# Patient Record
Sex: Male | Born: 1960 | Race: Black or African American | Hispanic: No | Marital: Single | Smoking: Never smoker
Health system: Southern US, Community
[De-identification: ages and names within clinical notes are randomized; demographics above are authoritative.]

---

## 2009-02-25 ENCOUNTER — Emergency Department: Payer: Self-pay | Admitting: Emergency Medicine

## 2009-10-09 ENCOUNTER — Emergency Department: Payer: Self-pay | Admitting: Emergency Medicine

## 2010-04-08 ENCOUNTER — Emergency Department (HOSPITAL_COMMUNITY): Admission: EM | Admit: 2010-04-08 | Discharge: 2010-04-09 | Payer: Self-pay | Admitting: Emergency Medicine

## 2010-12-15 LAB — URINE MICROSCOPIC-ADD ON

## 2010-12-15 LAB — URINALYSIS, ROUTINE W REFLEX MICROSCOPIC
Bilirubin Urine: NEGATIVE
Glucose, UA: NEGATIVE mg/dL
Ketones, ur: NEGATIVE mg/dL
Leukocytes, UA: NEGATIVE
Protein, ur: NEGATIVE mg/dL
Specific Gravity, Urine: 1.029 (ref 1.005–1.030)
pH: 5.5 (ref 5.0–8.0)

## 2011-04-27 ENCOUNTER — Emergency Department (HOSPITAL_COMMUNITY)
Admission: EM | Admit: 2011-04-27 | Discharge: 2011-04-27 | Disposition: A | Discharge status: Home / Self Care | Payer: Self-pay | Attending: Emergency Medicine | Admitting: Emergency Medicine

## 2011-04-27 DIAGNOSIS — M25539 Pain in unspecified wrist: Secondary | ICD-10-CM | POA: Insufficient documentation

## 2011-04-27 DIAGNOSIS — M65839 Other synovitis and tenosynovitis, unspecified forearm: Secondary | ICD-10-CM | POA: Insufficient documentation

## 2011-04-27 DIAGNOSIS — M65849 Other synovitis and tenosynovitis, unspecified hand: Secondary | ICD-10-CM | POA: Insufficient documentation

## 2012-07-15 ENCOUNTER — Ambulatory Visit: Payer: Self-pay

## 2012-07-15 LAB — CBC WITH DIFFERENTIAL/PLATELET
Basophil #: 0.1 10*3/uL (ref 0.0–0.1)
Eosinophil %: 6.9 %
HGB: 14.9 g/dL (ref 13.0–18.0)
Lymphocyte %: 29.9 %
MCH: 29.3 pg (ref 26.0–34.0)
MCHC: 33.9 g/dL (ref 32.0–36.0)
Monocyte %: 11.2 %
Neutrophil #: 3.1 10*3/uL (ref 1.4–6.5)
Platelet: 142 10*3/uL — ABNORMAL LOW (ref 150–440)
RDW: 14 % (ref 11.5–14.5)

## 2012-07-15 LAB — COMPREHENSIVE METABOLIC PANEL
Alkaline Phosphatase: 70 U/L (ref 50–136)
Anion Gap: 7 (ref 7–16)
Bilirubin,Total: 1 mg/dL (ref 0.2–1.0)
Calcium, Total: 9.3 mg/dL (ref 8.5–10.1)
Chloride: 103 mmol/L (ref 98–107)
Co2: 26 mmol/L (ref 21–32)
EGFR (African American): >60
EGFR (Non-African Amer.): >60
Osmolality: 275 (ref 275–301)
Potassium: 3.8 mmol/L (ref 3.5–5.1)
Sodium: 136 mmol/L (ref 136–145)

## 2013-05-08 ENCOUNTER — Emergency Department: Payer: Self-pay | Admitting: Emergency Medicine

## 2013-05-08 LAB — CBC WITH DIFFERENTIAL/PLATELET
Eosinophil %: 4.6 %
HCT: 42.7 % (ref 40.0–52.0)
MCHC: 34.8 g/dL (ref 32.0–36.0)
Monocyte #: 0.8 x10 3/mm (ref 0.2–1.0)
Neutrophil #: 6 10*3/uL (ref 1.4–6.5)
Neutrophil %: 63.9 %
Platelet: 156 10*3/uL (ref 150–440)
RDW: 14.4 % (ref 11.5–14.5)
WBC: 9.4 10*3/uL (ref 3.8–10.6)

## 2013-05-08 LAB — COMPREHENSIVE METABOLIC PANEL
Anion Gap: 5 — ABNORMAL LOW (ref 7–16)
Bilirubin,Total: 0.6 mg/dL (ref 0.2–1.0)
Chloride: 105 mmol/L (ref 98–107)
Co2: 28 mmol/L (ref 21–32)
EGFR (African American): >60
SGOT(AST): 31 U/L (ref 15–37)
SGPT (ALT): 23 U/L (ref 12–78)

## 2013-05-08 LAB — TROPONIN I: Troponin-I: <0.02 ng/mL

## 2013-05-13 ENCOUNTER — Ambulatory Visit: Payer: Self-pay | Admitting: Family Medicine

## 2013-05-20 ENCOUNTER — Ambulatory Visit: Payer: Self-pay | Admitting: Family Medicine

## 2013-11-14 ENCOUNTER — Encounter (HOSPITAL_COMMUNITY): Payer: Self-pay | Admitting: Emergency Medicine

## 2013-11-14 ENCOUNTER — Emergency Department (HOSPITAL_COMMUNITY)
Admission: EM | Admit: 2013-11-14 | Discharge: 2013-11-14 | Disposition: A | Discharge status: Home / Self Care | Payer: Managed Care, Other (non HMO) | Attending: Emergency Medicine | Admitting: Emergency Medicine

## 2013-11-14 DIAGNOSIS — Y9389 Activity, other specified: Secondary | ICD-10-CM | POA: Insufficient documentation

## 2013-11-14 DIAGNOSIS — M549 Dorsalgia, unspecified: Secondary | ICD-10-CM

## 2013-11-14 DIAGNOSIS — IMO0002 Reserved for concepts with insufficient information to code with codable children: Secondary | ICD-10-CM | POA: Insufficient documentation

## 2013-11-14 DIAGNOSIS — M542 Cervicalgia: Secondary | ICD-10-CM

## 2013-11-14 DIAGNOSIS — S0993XA Unspecified injury of face, initial encounter: Secondary | ICD-10-CM | POA: Insufficient documentation

## 2013-11-14 DIAGNOSIS — S199XXA Unspecified injury of neck, initial encounter: Secondary | ICD-10-CM

## 2013-11-14 DIAGNOSIS — Y92410 Unspecified street and highway as the place of occurrence of the external cause: Secondary | ICD-10-CM | POA: Insufficient documentation

## 2013-11-14 MED ORDER — METHOCARBAMOL 500 MG PO TABS: 500.0000 mg | ORAL_TABLET | Freq: Two times a day (BID) | ORAL | Status: DC | PRN

## 2013-11-14 MED ORDER — HYDROCODONE-ACETAMINOPHEN 5-325 MG PO TABS: 1.0000 | ORAL_TABLET | ORAL | Status: DC | PRN
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
## 2013-11-14 NOTE — Discharge Instructions (Signed)
Take the prescribed medication as directed.  You will continue to be sore for the next several days.  May wish to apply heat to neck and back to help ease muscle soreness. Follow-up with your primary care physician if problems occur. Return to the ED for new or worsening symptoms.
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
## 2013-11-18 NOTE — ED Provider Notes (Signed)
Medical screening examination/treatment/procedure(s) were performed by non-physician practitioner and as supervising physician I was immediately available for consultation/collaboration.  EKG Interpretation   None         Teasia Zapf, MD 11/18/13 0822 

## 2014-12-19 ENCOUNTER — Ambulatory Visit (INDEPENDENT_AMBULATORY_CARE_PROVIDER_SITE_OTHER): Payer: Managed Care, Other (non HMO) | Admitting: Podiatry

## 2014-12-19 ENCOUNTER — Ambulatory Visit (INDEPENDENT_AMBULATORY_CARE_PROVIDER_SITE_OTHER): Payer: Managed Care, Other (non HMO)

## 2014-12-19 ENCOUNTER — Encounter: Payer: Self-pay | Admitting: Podiatry

## 2014-12-19 VITALS — BP 136/74 | HR 92 | Resp 16 | Ht 72.0 in | Wt 195.0 lb

## 2014-12-19 DIAGNOSIS — M722 Plantar fascial fibromatosis: Secondary | ICD-10-CM

## 2014-12-19 DIAGNOSIS — M779 Enthesopathy, unspecified: Secondary | ICD-10-CM | POA: Diagnosis not present

## 2014-12-19 MED ORDER — MELOXICAM 15 MG PO TABS: 15.0000 mg | ORAL_TABLET | Freq: Every day | ORAL | Status: DC

## 2014-12-19 NOTE — Progress Notes (Signed)
   Subjective:    Patient ID: Corey Ramos, male    DOB: 10/16/1960, 54 y.o.   MRN: [id number]  HPI 54 year old male presents the office with complaints of left foot and heel pain which has been ongoing for approximate one month. He states he has pain particularly in the bottom of his heel along the side of his foot which is worse in the morning which is relieved by ambulation. He denies any history of injury or trauma to the area and denies any change or increase in activity the time of onset of symptoms. He denies any numbness or tingling. The pain does not wake him up at night. No other complaints at this time.   Review of Systems  All other systems reviewed and are negative.      Objective:   Physical Exam AAO x3, NAD DP/PT pulses palpable bilaterally, CRT less than 3 seconds Protective sensation intact with Simms Weinstein monofilament, vibratory sensation intact, Achilles tendon reflex intact Tenderness to palpation overlying the plantar medial tubercle of the calcaneus to left heel at the insertion of the plantar fascia. There is no pain along the course of plantar fascial within the arch of the foot. There is no pain with lateral compression of the calcaneus or pain the vibratory sensation. No pain on the posterior aspect of the calcaneus or along the course/insertion of the Achilles tendon. Mild discomfort along the course of the peroneal tendons on the lateral aspect of the left foot. There is no pain with range of motion of fourth to person. Peroneal tendons appear to be intact. There is no overlying edema, erythema, increase in warmth. No other areas of tenderness palpation or pain with vibratory sensation to the foot/ankle. MMT 5/5, ROM WNL No open lesions or pre-ulcerative lesions are identified. No pain with calf compression, swelling, warmth, erythema.      Assessment & Plan:  54 year old male left foot plantar fasciitis, peroneal tendinitis -X-rays were obtained and  reviewed with the patient. -Treatment options were discussed including alternatives, risks, complications. -Discussed steroid injection. -Prescribed meloxicam. Discussed side effects the medication directed to stop immediately should any occur and call the office. -Discussed stretching exercises -Ice to the area -Discussed shoe gear modification and orthotics. -Follow-up in 4 weeks or sooner if any problems are to arise. In the meantime encouraged to call the office with any questions, concerns, change in symptoms.

## 2014-12-19 NOTE — Patient Instructions (Signed)

## 2014-12-24 ENCOUNTER — Encounter: Payer: Self-pay | Admitting: Podiatry

## 2015-01-11 ENCOUNTER — Ambulatory Visit: Payer: Managed Care, Other (non HMO) | Admitting: Podiatry

## 2015-01-18 ENCOUNTER — Ambulatory Visit (INDEPENDENT_AMBULATORY_CARE_PROVIDER_SITE_OTHER): Payer: Managed Care, Other (non HMO) | Admitting: Podiatry

## 2015-01-18 VITALS — BP 126/74 | HR 90 | Resp 16

## 2015-01-18 DIAGNOSIS — M722 Plantar fascial fibromatosis: Secondary | ICD-10-CM

## 2015-01-18 NOTE — Patient Instructions (Signed)
Plantar Fasciitis (Heel Spur Syndrome) with Rehab The plantar fascia is a fibrous, ligament-like, soft-tissue structure that spans the bottom of the foot. Plantar fasciitis is a condition that causes pain in the foot due to inflammation of the tissue. SYMPTOMS   Pain and tenderness on the underneath side of the foot.  Pain that worsens with standing or walking. CAUSES  Plantar fasciitis is caused by irritation and injury to the plantar fascia on the underneath side of the foot. Common mechanisms of injury include:  Direct trauma to bottom of the foot.  Damage to a small nerve that runs under the foot where the main fascia attaches to the heel bone.  Stress placed on the plantar fascia due to bone spurs. RISK INCREASES WITH:   Activities that place stress on the plantar fascia (running, jumping, pivoting, or cutting).  Poor strength and flexibility.  Improperly fitted shoes.  Tight calf muscles.  Flat feet.  Failure to warm-up properly before activity.  Obesity. PREVENTION  Warm up and stretch properly before activity.  Allow for adequate recovery between workouts.  Maintain physical fitness:  Strength, flexibility, and endurance.  Cardiovascular fitness.  Maintain a health body weight.  Avoid stress on the plantar fascia.  Wear properly fitted shoes, including arch supports for individuals who have flat feet. PROGNOSIS  If treated properly, then the symptoms of plantar fasciitis usually resolve without surgery. However, occasionally surgery is necessary. RELATED COMPLICATIONS   Recurrent symptoms that may result in a chronic condition.  Problems of the lower back that are caused by compensating for the injury, such as limping.  Pain or weakness of the foot during push-off following surgery.  Chronic inflammation, scarring, and partial or complete fascia tear, occurring more often from repeated injections. TREATMENT  Treatment initially involves the use of  ice and medication to help reduce pain and inflammation. The use of strengthening and stretching exercises may help reduce pain with activity, especially stretches of the Achilles tendon. These exercises may be performed at home or with a therapist. Your caregiver may recommend that you use heel cups of arch supports to help reduce stress on the plantar fascia. Occasionally, corticosteroid injections are given to reduce inflammation. If symptoms persist for greater than 6 months despite non-surgical (conservative), then surgery may be recommended.  MEDICATION   If pain medication is necessary, then nonsteroidal anti-inflammatory medications, such as aspirin and ibuprofen, or other minor pain relievers, such as acetaminophen, are often recommended.  Do not take pain medication within 7 days before surgery.  Prescription pain relievers may be given if deemed necessary by your caregiver. Use only as directed and only as much as you need.  Corticosteroid injections may be given by your caregiver. These injections should be reserved for the most serious cases, because they may only be given a certain number of times. HEAT AND COLD  Cold treatment (icing) relieves pain and reduces inflammation. Cold treatment should be applied for 10 to 15 minutes every 2 to 3 hours for inflammation and pain and immediately after any activity that aggravates your symptoms. Use ice packs or massage the area with a piece of ice (ice massage).  Heat treatment may be used prior to performing the stretching and strengthening activities prescribed by your caregiver, physical therapist, or athletic trainer. Use a heat pack or soak the injury in warm water. SEEK IMMEDIATE MEDICAL CARE IF:  Treatment seems to offer no benefit, or the condition worsens.  Any medications produce adverse side effects. EXERCISES RANGE   OF MOTION (ROM) AND STRETCHING EXERCISES - Plantar Fasciitis (Heel Spur Syndrome) These exercises may help you  when beginning to rehabilitate your injury. Your symptoms may resolve with or without further involvement from your physician, physical therapist or athletic trainer. While completing these exercises, remember:   Restoring tissue flexibility helps normal motion to return to the joints. This allows healthier, less painful movement and activity.  An effective stretch should be held for at least 30 seconds.  A stretch should never be painful. You should only feel a gentle lengthening or release in the stretched tissue. RANGE OF MOTION - Toe Extension, Flexion  Sit with your right / left leg crossed over your opposite knee.  Grasp your toes and gently pull them back toward the top of your foot. You should feel a stretch on the bottom of your toes and/or foot.  Hold this stretch for __________ seconds.  Now, gently pull your toes toward the bottom of your foot. You should feel a stretch on the top of your toes and or foot.  Hold this stretch for __________ seconds. Repeat __________ times. Complete this stretch __________ times per day.  RANGE OF MOTION - Ankle Dorsiflexion, Active Assisted  Remove shoes and sit on a chair that is preferably not on a carpeted surface.  Place right / left foot under knee. Extend your opposite leg for support.  Keeping your heel down, slide your right / left foot back toward the chair until you feel a stretch at your ankle or calf. If you do not feel a stretch, slide your bottom forward to the edge of the chair, while still keeping your heel down.  Hold this stretch for __________ seconds. Repeat __________ times. Complete this stretch __________ times per day.  STRETCH - Gastroc, Standing  Place hands on wall.  Extend right / left leg, keeping the front knee somewhat bent.  Slightly point your toes inward on your back foot.  Keeping your right / left heel on the floor and your knee straight, shift your weight toward the wall, not allowing your back to  arch.  You should feel a gentle stretch in the right / left calf. Hold this position for __________ seconds. Repeat __________ times. Complete this stretch __________ times per day. STRETCH - Soleus, Standing  Place hands on wall.  Extend right / left leg, keeping the other knee somewhat bent.  Slightly point your toes inward on your back foot.  Keep your right / left heel on the floor, bend your back knee, and slightly shift your weight over the back leg so that you feel a gentle stretch deep in your back calf.  Hold this position for __________ seconds. Repeat __________ times. Complete this stretch __________ times per day. STRETCH - Gastrocsoleus, Standing  Note: This exercise can place a lot of stress on your foot and ankle. Please complete this exercise only if specifically instructed by your caregiver.   Place the ball of your right / left foot on a step, keeping your other foot firmly on the same step.  Hold on to the wall or a rail for balance.  Slowly lift your other foot, allowing your body weight to press your heel down over the edge of the step.  You should feel a stretch in your right / left calf.  Hold this position for __________ seconds.  Repeat this exercise with a slight bend in your right / left knee. Repeat __________ times. Complete this stretch __________ times per day.    STRENGTHENING EXERCISES - Plantar Fasciitis (Heel Spur Syndrome)  These exercises may help you when beginning to rehabilitate your injury. They may resolve your symptoms with or without further involvement from your physician, physical therapist or athletic trainer. While completing these exercises, remember:   Muscles can gain both the endurance and the strength needed for everyday activities through controlled exercises.  Complete these exercises as instructed by your physician, physical therapist or athletic trainer. Progress the resistance and repetitions only as guided. STRENGTH -  Towel Curls  Sit in a chair positioned on a non-carpeted surface.  Place your foot on a towel, keeping your heel on the floor.  Pull the towel toward your heel by only curling your toes. Keep your heel on the floor.  If instructed by your physician, physical therapist or athletic trainer, add ____________________ at the end of the towel. Repeat __________ times. Complete this exercise __________ times per day. STRENGTH - Ankle Inversion  Secure one end of a rubber exercise band/tubing to a fixed object (table, pole). Loop the other end around your foot just before your toes.  Place your fists between your knees. This will focus your strengthening at your ankle.  Slowly, pull your big toe up and in, making sure the band/tubing is positioned to resist the entire motion.  Hold this position for __________ seconds.  Have your muscles resist the band/tubing as it slowly pulls your foot back to the starting position. Repeat __________ times. Complete this exercises __________ times per day.  Document Released: 09/15/2005 Document Revised: 12/08/2011 Document Reviewed: 12/28/2008 [employer] Patient Information 2015 [employer]. This information is not intended to replace advice given to you by your health care provider. Make sure you discuss any questions you have with your health care provider.  

## 2015-01-24 ENCOUNTER — Encounter: Payer: Self-pay | Admitting: Podiatry

## 2015-01-24 NOTE — Progress Notes (Signed)
Patient ID: Corey Ramos, male   DOB: 09/16/1961, 54 y.o.   MRN: [id number]  Subjective: 54 year old male presents the office they follow up evaluation of left heel pain, plantar fasciitis and peroneal tendinitis. He states that he is much improved compared to last appointment however he is not 100%. He states that he continues with mild intermittent discomfort to the bottom of the heel a states the area feels tight. He has no pain along the office after the ankle along the course of the peroneal tendons. He's been continuing stretching icing activities. Denies any systemic complaints such as fevers, chills, nausea, vomiting. No acute changes since last appointment, and no other complaints at this time.   Objective: AAO x3, NAD DP/PT pulses palpable bilaterally, CRT less than 3 seconds Protective sensation intact with Simms Weinstein monofilament, vibratory sensation intact, Achilles tendon reflex intact There is mild tenderness to palpation overlying the plantar medial tubercle of the calcaneus to left heel at the insertion of the plantar fascia although it does appear improved compared to previous appointment. There is no pain along the course of plantar fascial within the arch of the foot and the plantar fascia appears intact. There is no pain with lateral compression of the calcaneus or pain the vibratory sensation. No pain on the posterior aspect of the calcaneus or along the course/insertion of the Achilles tendon. There is no discomfort along the course of the peroneal tendons. There is no overlying edema, erythema, increase in warmth. No other areas of tenderness palpation or pain with vibratory sensation to the foot/ankle. MMT 5/5, ROM WNL No open lesions or pre-ulcerative lesions are identified. No pain with calf compression, swelling, warmth, erythema.  Assessment: 54 year old male with resolving left heel pain, plantar fasciitis  Plan: -All treatment options discussed with the patient  including all alternatives, risks, complications.  -At this time discussed another steroid injection however patient was to hold off at this time. -Dispensed night splint -Continue stretching activities -Ice to the area -Discussed shoe gear modifications and possible orthotics -Follow-up in 4 weeks or sooner if any problems are to arise. -Patient encouraged to call the office with any questions, concerns, change in symptoms.

## 2015-02-15 ENCOUNTER — Ambulatory Visit: Payer: Managed Care, Other (non HMO) | Admitting: Podiatry

## 2015-05-28 IMAGING — CT CT ABDOMEN W/ CM
2 of 4 series · 14 of 32 positions shown, 19 images · non-contrast
Comparison: none

REASON FOR EXAM: liver mass seen on US
COMMENTS:

[Series 4: arterial 3 · axial · arterial · 0.72mm/px · z∈[-720,-570]mm · 6 of 91 slices shown]
[im 11/91  soft-tissue]
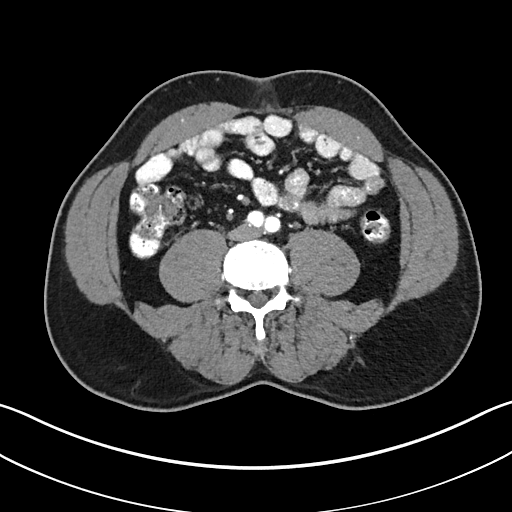
[im 21/91  soft-tissue]
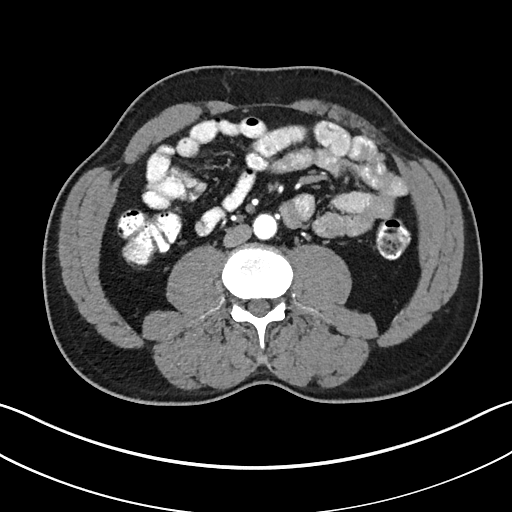
[im 31/91  soft-tissue]
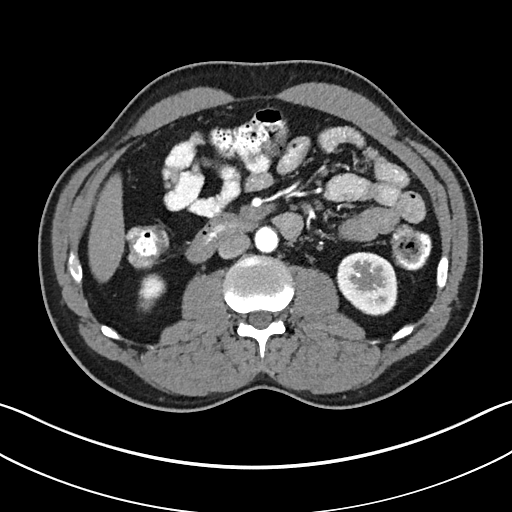
[im 41/91  soft-tissue]
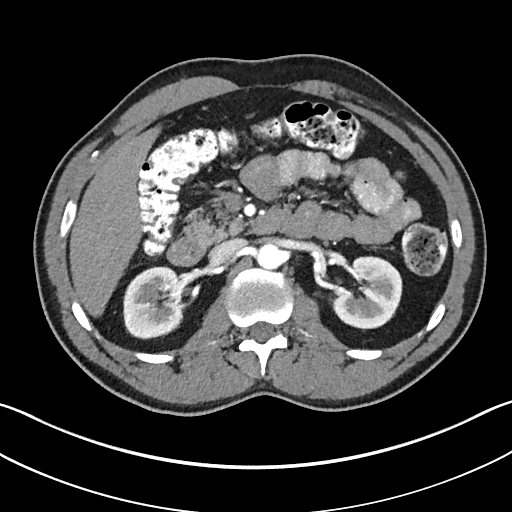
[im 51/91  soft-tissue]
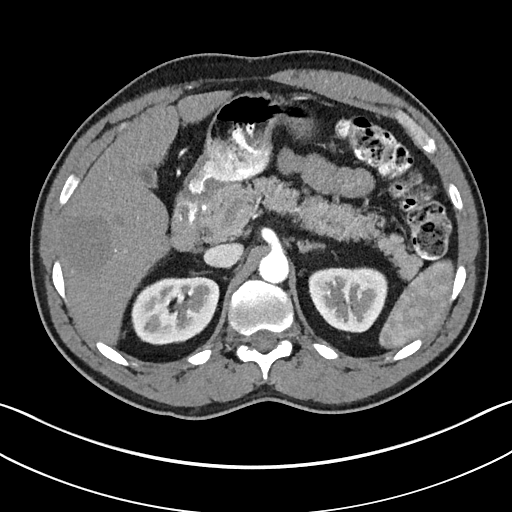
[im 61/91  soft-tissue]
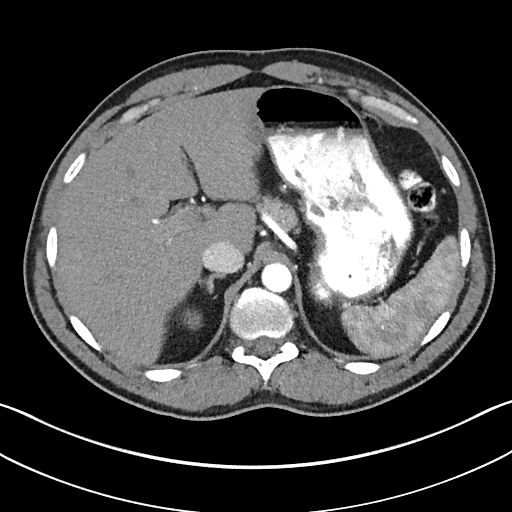

[Series 6: venous 3 · axial · portal-venous · 0.72mm/px · z∈[-708,-510]mm · 8 of 86 slices shown, 13 images]
[im 10/86  soft-tissue]
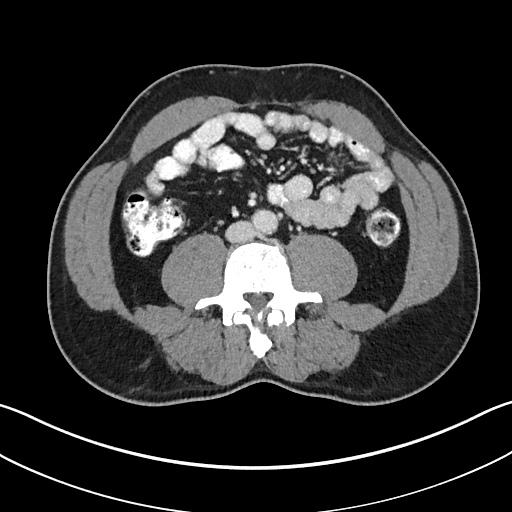
[im 10/86  bone]
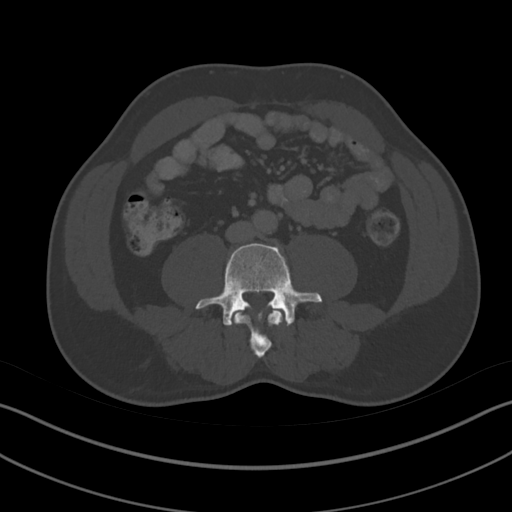
[im 19/86  soft-tissue]
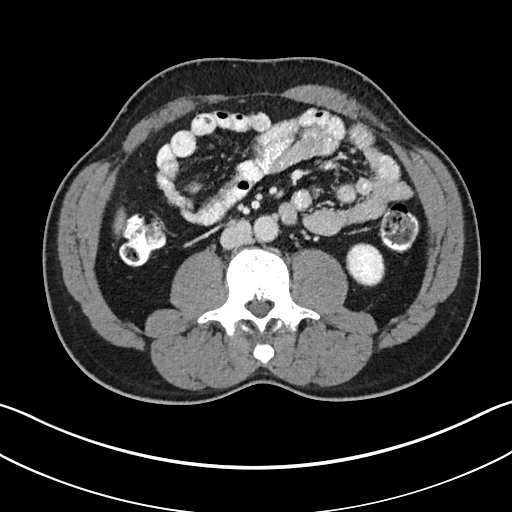
[im 29/86  soft-tissue]
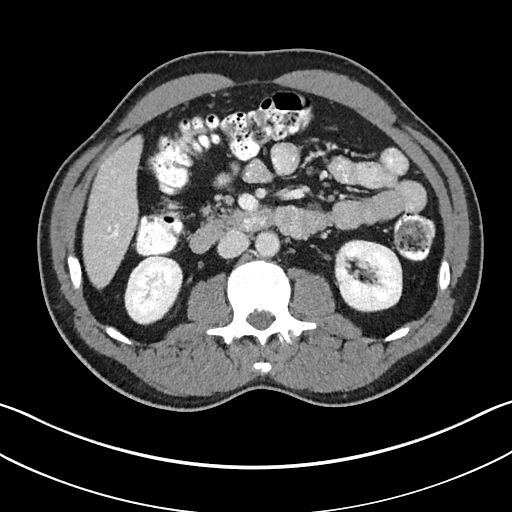
[im 38/86  soft-tissue]
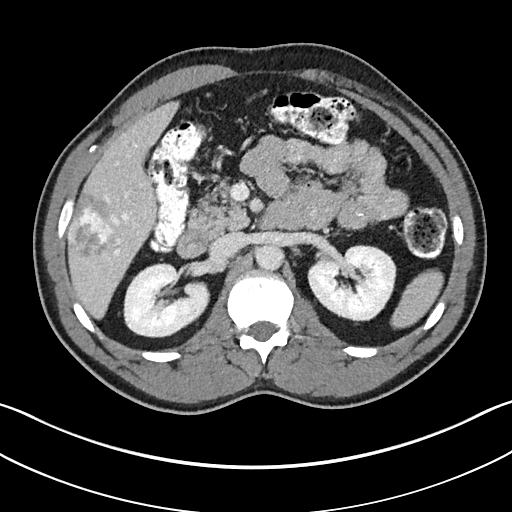
[im 48/86  soft-tissue]
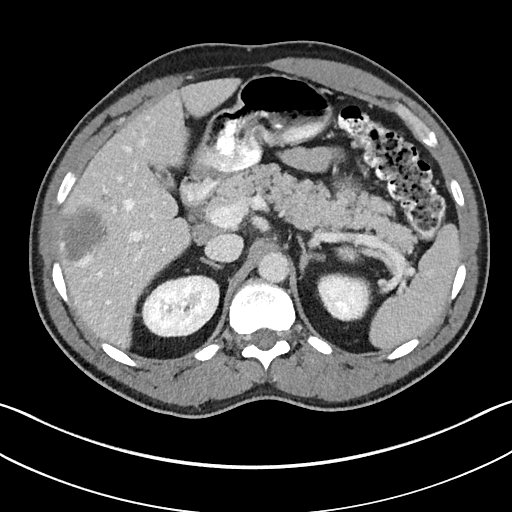
[im 48/86  lung]
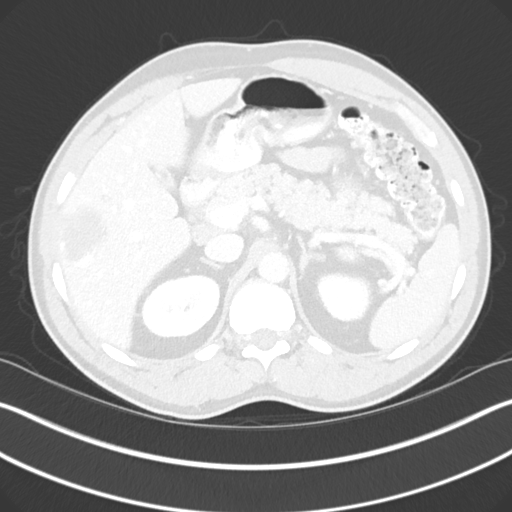
[im 57/86  soft-tissue]
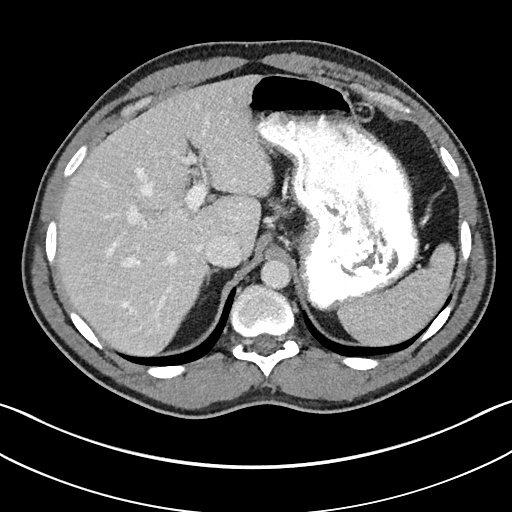
[im 57/86  lung]
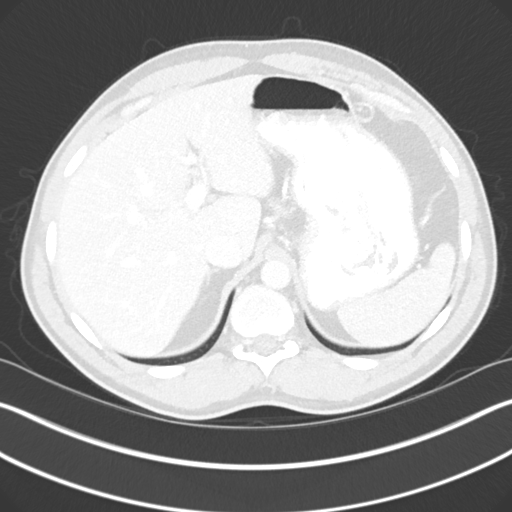
[im 67/86  soft-tissue]
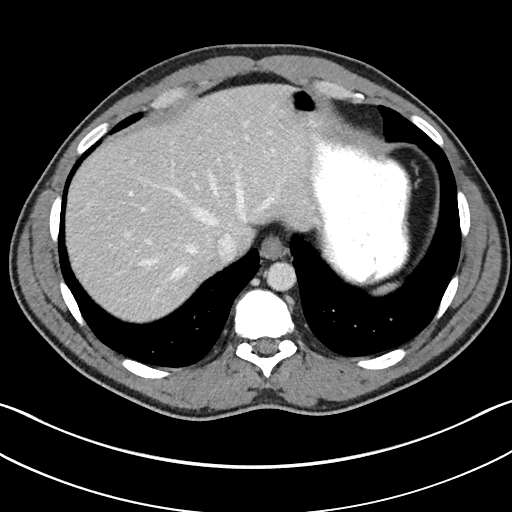
[im 67/86  lung]
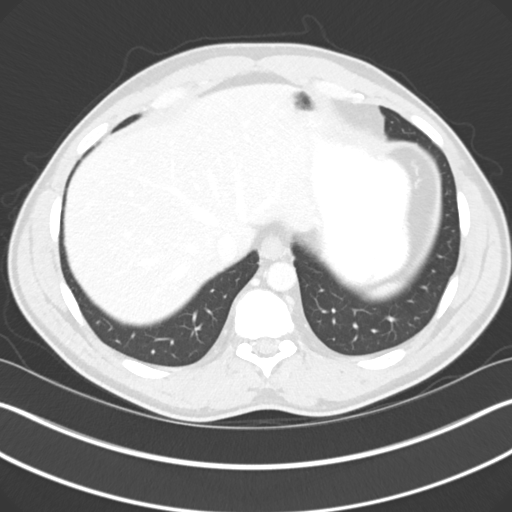
[im 76/86  soft-tissue]
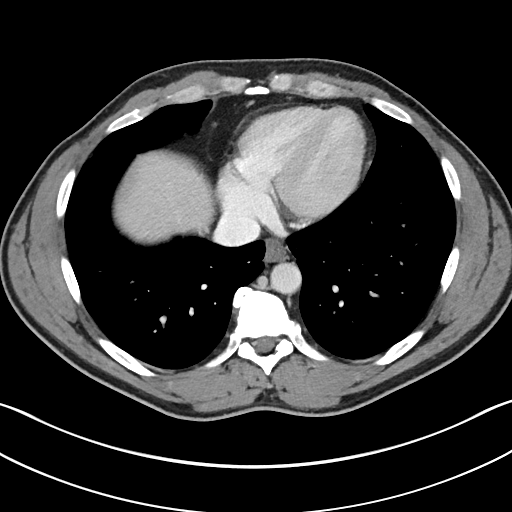
[im 76/86  lung]
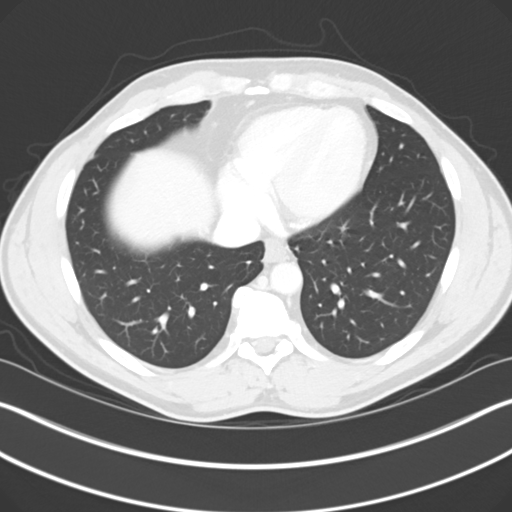

[14 of 32 positions shown; findings below may reference images not displayed]

PROCEDURE:     KCT - KCT ABDOMEN STANDARD W  - May 20, 2013  [DATE]

RESULT:     CT with contrast is performed through the abdomen with images
reconstructed at 3 mm slice thickness in the axial plane compared to
previous noncontrast exam of 05/08/2013. The patient received 100 mL of
Nsovue-SXA iodinated intravenous contrast.

The low-attenuation area laterally in the inferior portion of the right lobe
of the liver shows evidence of peripheral enhancement with a globular
pattern especially inferiorly on today's exam. The lesion measures
approximately 4.37 cm anterior-posterior and 4.07 cm transversely on image
41. Delayed postcontrast images show progressive enhancement marginally
especially inferiorly. A 6 minute delay shows even further enhancement.
Findings consistent with a cavernous hemangioma. The liver otherwise appears
unremarkable. The spleen, kidneys, nondistended gallbladder, pancreas,
adrenal glands, including stomach, small bowel and colon and bony structures
appear unremarkable. There is no pleural or pericardial effusion. The lung
bases are clear.
IMPRESSION: 1. Probable large hemangioma in the inferior right lobe of the liver. Study
is otherwise unremarkable.

[REDACTED]

## 2018-01-11 ENCOUNTER — Ambulatory Visit: Payer: Self-pay | Admitting: Nurse Practitioner

## 2018-01-19 ENCOUNTER — Ambulatory Visit: Payer: Managed Care, Other (non HMO) | Admitting: Family Medicine

## 2018-01-19 ENCOUNTER — Encounter: Payer: Self-pay | Admitting: Family Medicine

## 2018-01-19 VITALS — BP 121/81 | HR 75 | Temp 98.3°F | Resp 16 | Ht 72.0 in | Wt 195.8 lb

## 2018-01-19 DIAGNOSIS — H538 Other visual disturbances: Secondary | ICD-10-CM | POA: Diagnosis not present

## 2018-01-19 DIAGNOSIS — Z7689 Persons encountering health services in other specified circumstances: Secondary | ICD-10-CM

## 2018-01-19 DIAGNOSIS — Z1211 Encounter for screening for malignant neoplasm of colon: Secondary | ICD-10-CM | POA: Diagnosis not present

## 2018-01-19 NOTE — Progress Notes (Signed)
Subjective:    Patient ID: Corey Ramos, male    DOB: [birth year], 57 y.o.   MRN: [id number]  Corey FRANKIE is a 57 y.o. male presenting on 01/19/2018 for Establish Care and Blurred Vision  Previously saw Dr Juanetta Gosling as PCP here at [facility] about 2015, now he is re-establishing care here at [facility], here to meet new provider and he will return for labs and biometric screening.  HPI   Lifestyle / Overweight BMI >26 - Diet: balanced diet, no specific dietary exclusion., drinks mostly water and gatorade, quit drinking soda 1 month ago - Exercise: Active at work in [employer], he does go to [employer] for other exercise about 1-2 weeks out of a month - He works in [employer] representative, various jobs, some heavy Pharmacologist, travels out of state for projects - No known chronic OA/DJD arthritis. He does take OTC Ibuprofen 200mg  x 4 for dose 800mg  PRN, rarely use maybe once every few months. - Currently lives with girlfriend and son  History of cerumen building / wax in past - required cleaning  Blurry Vision Now past 1 month has some difficulty with blurry vision in distance vision, requires reading glasses for close up reading - Previous eye doctor at [facility] - Denies eye pain or redness, discharge   Health Maintenance:  Due routine Hep C and HIV screening - he reports has had these checked before years ago does not have result available, agree to check again.  Prostate CA Screening: No prior prostate CA screening PSA / DRE. Currently asymptomatic w/o BPH. No known family history of prostate CA but he has history of father with cancer at age 67 unknown type. He will find out. Due for screening.  Colon CA Screening: Never had colonoscopy. Currently asymptomatic. No known family history of colon CA again he has father with h/o cancer at age 11. Due for screening test new referral to GI for colonoscopy    Depression screen [facility] 2/9 01/19/2018  Decreased Interest 0  Down,  Depressed, Hopeless 0  PHQ - 2 Score 0    History reviewed. No pertinent past medical history. History reviewed. No pertinent surgical history. Social History   Socioeconomic History  . Marital status: Single    Spouse name: Not on file  . Number of children: Not on file  . Years of education: 11th Grade / GED  . Highest education level: GED or equivalent  Occupational History  . Occupation: Training and development officer  . Financial resource strain: Not on file  . Food insecurity:    Worry: Not on file    Inability: Not on file  . Transportation needs:    Medical: Not on file    Non-medical: Not on file  Tobacco Use  . Smoking status: Never Smoker  . Smokeless tobacco: Never Used  Substance and Sexual Activity  . Alcohol use: Yes    Alcohol/week: 0.6 - 2.4 oz    Types: 1 - 4 Shots of liquor per week    Comment: social, weekends usually  . Drug use: Never  . Sexual activity: Not on file  Lifestyle  . Physical activity:    Days per week: Not on file    Minutes per session: Not on file  . Stress: Not on file  Relationships  . Social connections:    Talks on phone: Not on file    Gets together: Not on file    Attends religious service: Not on file  Active member of club or organization: Not on file    Attends meetings of clubs or organizations: Not on file    Relationship status: Not on file  . Intimate partner violence:    Fear of current or ex partner: Not on file    Emotionally abused: Not on file    Physically abused: Not on file    Forced sexual activity: Not on file  Other Topics Concern  . Not on file  Social History Narrative  . Not on file   Family History  Problem Relation Age of Onset  . Cancer Father   . Colon cancer Neg Hx   . Prostate cancer Neg Hx    No current outpatient medications on file prior to visit.   No current facility-administered medications on file prior to visit.     Review of Systems  Constitutional: Negative for activity  change, appetite change, chills, diaphoresis, fatigue and fever.  HENT: Negative for congestion and hearing loss.   Eyes: Positive for visual disturbance (blurry vision).  Respiratory: Negative for apnea, cough, chest tightness, shortness of breath and wheezing.   Cardiovascular: Negative for chest pain, palpitations and leg swelling.  Gastrointestinal: Negative for abdominal pain, anal bleeding, blood in stool, constipation, diarrhea, nausea and vomiting.  Endocrine: Negative for cold intolerance.  Genitourinary: Negative for difficulty urinating, dysuria, frequency and hematuria.  Musculoskeletal: Negative for arthralgias and neck pain.  Skin: Negative for rash.  Allergic/Immunologic: Negative for environmental allergies.  Neurological: Negative for dizziness, weakness, light-headedness, numbness and headaches.  Hematological: Negative for adenopathy.  Psychiatric/Behavioral: Negative for behavioral problems, dysphoric mood and sleep disturbance. The patient is not nervous/anxious.    Per HPI unless specifically indicated above     Objective:    BP 121/81   Pulse 75   Temp 98.3 F (36.8 C) (Oral)   Resp 16   Ht 6' (1.829 m)   Wt 195 lb 12.8 oz (88.8 kg)   BMI 26.56 kg/m   Wt Readings from Last 3 Encounters:  01/19/18 195 lb 12.8 oz (88.8 kg)  12/19/14 195 lb (88.5 kg)    Physical Exam  Constitutional: He is oriented to person, place, and time. He appears well-developed and well-nourished. No distress.  Well-appearing, comfortable, cooperative  HENT:  Head: Normocephalic and atraumatic.  Mouth/Throat: Oropharynx is clear and moist.  Eyes: Pupils are equal, round, and reactive to light. Conjunctivae and EOM are normal. Right eye exhibits no discharge. Left eye exhibits no discharge.  Cardiovascular: Normal rate.  Pulmonary/Chest: Effort normal.  Abdominal: Soft. There is no tenderness.  Musculoskeletal: He exhibits no edema.  Neurological: He is alert and oriented to  person, place, and time.  Skin: Skin is warm and dry. No rash noted. He is not diaphoretic. No erythema.  Psychiatric: He has a normal mood and affect. His behavior is normal.  Well groomed, good eye contact, normal speech and thoughts  Nursing note and vitals reviewed.  Results for orders placed or performed during the hospital encounter of 04/08/10  Urinalysis, Routine w reflex microscopic  Result Value Ref Range   Color, Urine YELLOW YELLOW   APPearance CLEAR CLEAR   Specific Gravity, Urine 1.029 1.005 - 1.030   pH 5.5 5.0 - 8.0   Glucose, UA NEGATIVE NEGATIVE mg/dL   Hgb urine dipstick LARGE (A) NEGATIVE   Bilirubin Urine NEGATIVE NEGATIVE   Ketones, ur NEGATIVE NEGATIVE mg/dL   Protein, ur NEGATIVE NEGATIVE mg/dL   Urobilinogen, UA 0.2 0.0 - 1.0 mg/dL  Nitrite NEGATIVE NEGATIVE   Leukocytes, UA NEGATIVE NEGATIVE  Urine microscopic-add on  Result Value Ref Range   WBC, UA 0-2 <3 WBC/hpf   RBC / HPF 11-20 <3 RBC/hpf   Bacteria, UA RARE RARE   Casts HYALINE CASTS (A) NEGATIVE   Urine-Other MUCOUS PRESENT       Assessment & Plan:   Problem List Items Addressed This Visit    None    Visit Diagnoses    Blurry vision, bilateral    -  Primary   Bilateral blurry vision gradual worsening in eye w/o red flag symptoms, no pain or other abnormality on exam, will refer back to Ophtho   Screen for colon cancer       Referral to [facility] for initial screening colonoscopy   Relevant Orders   Ambulatory referral to Gastroenterology   Encounter to establish care with new doctor       Will review outside records from prior PCP, not available in current EHR      #Overweight BMI >26 Encourage improve lifestyle diet exercise  #Prostate CA Screening Will check PSA next labs  No orders of the defined types were placed in this encounter.  Orders Placed This Encounter  Procedures  . Ambulatory referral to Gastroenterology    Referral Priority:   Routine    Referral Type:    Consultation    Referral Reason:   Specialty Services Required    Number of Visits Requested:   1     Follow up plan: Return in about 1 week (around 01/26/2018) for fasting lab, then 1 week later annual physical.   Given contact info to him for Ophthalmology - [facility] to schedule apt  Future labs ordered for 01/25/18 (CMET, Lipid, A1c, CBC, HIV, Hep C, PSA)  Saralyn Pilar, DO [facility] 01/20/2018, 12:40 AM

## 2018-01-19 NOTE — Patient Instructions (Addendum)
Thank you for coming to the office today.  We will complete Biometric at next visit.  Stay tuned for apt for Colonoscopy from GI doctors  [facility]([place]) [address] [place], [place] [address] Phone: [phone]  [facility]([place]) [address] [place], [place] [address] Main: [phone]  --------------- Call and schedule general eye evaluation  [facility] [address], [place], [place] [address] Phone: [phone] [email or web address]  DUE for FASTING BLOOD WORK (no food or drink after midnight before the lab appointment, only water or coffee without cream/sugar on the morning of)  SCHEDULE "Lab Only" visit in the morning at the clinic for lab draw in 1 WEEK   - Make sure Lab Only appointment is at about 1 week before your next appointment, so that results will be available  For Lab Results, once available within 2-3 days of blood draw, you can can log in to MyChart online to view your results and a brief explanation. Also, we can discuss results at next follow-up visit.   Please schedule a Follow-up Appointment to: Return in about 1 week (around 01/26/2018) for fasting lab, then 1 week later annual physical.  If you have any other questions or concerns, please feel free to call the office or send a message through MyChart. You may also schedule an earlier appointment if necessary.  Additionally, you may be receiving a survey about your experience at our office within a few days to 1 week by e-mail or mail. We value your feedback.  Saralyn PilarAlexander Izaia Say, DO [facility], [place]

## 2018-01-20 ENCOUNTER — Other Ambulatory Visit: Payer: Self-pay | Admitting: Family Medicine

## 2018-01-20 DIAGNOSIS — Z125 Encounter for screening for malignant neoplasm of prostate: Secondary | ICD-10-CM

## 2018-01-20 DIAGNOSIS — Z0000 Encounter for general adult medical examination without abnormal findings: Secondary | ICD-10-CM

## 2018-01-20 DIAGNOSIS — H538 Other visual disturbances: Secondary | ICD-10-CM

## 2018-01-20 DIAGNOSIS — R799 Abnormal finding of blood chemistry, unspecified: Secondary | ICD-10-CM

## 2018-01-20 DIAGNOSIS — R7309 Other abnormal glucose: Secondary | ICD-10-CM

## 2018-01-20 DIAGNOSIS — Z1159 Encounter for screening for other viral diseases: Secondary | ICD-10-CM

## 2018-01-20 DIAGNOSIS — Z114 Encounter for screening for human immunodeficiency virus [HIV]: Secondary | ICD-10-CM

## 2018-01-20 DIAGNOSIS — Z13220 Encounter for screening for lipoid disorders: Secondary | ICD-10-CM

## 2018-01-21 ENCOUNTER — Telehealth: Payer: Self-pay | Admitting: Gastroenterology

## 2018-01-21 NOTE — Telephone Encounter (Signed)
Gastroenterology Pre-Procedure Review  Request Date:  Requesting Physician: Dr.   PATIENT REVIEW QUESTIONS: The patient responded to the following health history questions as indicated:    1. Are you having any GI issues? no 2. Do you have a personal history of Polyps? no 3. Do you have a family history of Colon Cancer or Polyps? no 4. Diabetes Mellitus? no 5. Joint replacements in the past 12 months?no 6. Major health problems in the past 3 months?no 7. Any artificial heart valves, MVP, or defibrillator?no    MEDICATIONS & ALLERGIES:    Patient reports the following regarding taking any anticoagulation/antiplatelet therapy:   Plavix, Coumadin, Eliquis, Xarelto, Lovenox, Pradaxa, Brilinta, or Effient? no Aspirin? no  Patient confirms/reports the following medications:  No current outpatient medications on file.   No current facility-administered medications for this visit.     Patient confirms/reports the following allergies:  No Known Allergies  No orders of the defined types were placed in this encounter.   AUTHORIZATION INFORMATION Primary Insurance: 1D#: Group #:  Secondary Insurance: 1D#: Group #:  SCHEDULE INFORMATION: Date:  Time: Location:

## 2018-01-22 ENCOUNTER — Other Ambulatory Visit: Payer: Self-pay

## 2018-01-22 DIAGNOSIS — Z1159 Encounter for screening for other viral diseases: Secondary | ICD-10-CM

## 2018-01-22 DIAGNOSIS — R799 Abnormal finding of blood chemistry, unspecified: Secondary | ICD-10-CM

## 2018-01-22 DIAGNOSIS — R7309 Other abnormal glucose: Secondary | ICD-10-CM

## 2018-01-22 DIAGNOSIS — Z125 Encounter for screening for malignant neoplasm of prostate: Secondary | ICD-10-CM

## 2018-01-22 DIAGNOSIS — Z114 Encounter for screening for human immunodeficiency virus [HIV]: Secondary | ICD-10-CM

## 2018-01-22 DIAGNOSIS — Z13220 Encounter for screening for lipoid disorders: Secondary | ICD-10-CM

## 2018-01-22 DIAGNOSIS — Z0000 Encounter for general adult medical examination without abnormal findings: Secondary | ICD-10-CM

## 2018-01-25 ENCOUNTER — Other Ambulatory Visit: Payer: Managed Care, Other (non HMO)

## 2018-01-25 LAB — COMPLETE METABOLIC PANEL WITH GFR
AG Ratio: 1.5 (calc) (ref 1.0–2.5)
ALBUMIN MSPROF: 4.3 g/dL (ref 3.6–5.1)
ALT: 12 U/L (ref 9–46)
AST: 16 U/L (ref 10–35)
Alkaline phosphatase (APISO): 47 U/L (ref 40–115)
BUN: 25 mg/dL (ref 7–25)
CALCIUM: 9.6 mg/dL (ref 8.6–10.3)
CO2: 29 mmol/L (ref 20–32)
Chloride: 103 mmol/L (ref 98–110)
Creat: 1.16 mg/dL (ref 0.70–1.33)
GFR, EST NON AFRICAN AMERICAN: 71 mL/min/{1.73_m2} (ref >=60)
GFR, Est African American: 82 mL/min/{1.73_m2} (ref >=60)
GLOBULIN: 2.8 g/dL (ref 1.9–3.7)
Glucose, Bld: 116 mg/dL — ABNORMAL HIGH (ref 65–99)
POTASSIUM: 4.4 mmol/L (ref 3.5–5.3)
Sodium: 139 mmol/L (ref 135–146)
Total Bilirubin: 1 mg/dL (ref 0.2–1.2)
Total Protein: 7.1 g/dL (ref 6.1–8.1)

## 2018-01-25 LAB — CBC WITH DIFFERENTIAL/PLATELET
BASOS ABS: 62 {cells}/uL (ref 0–200)
Basophils Relative: 1 %
EOS ABS: 515 {cells}/uL — AB (ref 15–500)
Eosinophils Relative: 8.3 %
HCT: 45 % (ref 38.5–50.0)
Hemoglobin: 15.2 g/dL (ref 13.2–17.1)
Lymphs Abs: 1817 cells/uL (ref 850–3900)
MCH: 28.5 pg (ref 27.0–33.0)
MCHC: 33.8 g/dL (ref 32.0–36.0)
MCV: 84.3 fL (ref 80.0–100.0)
MONOS PCT: 9.2 %
MPV: 11.6 fL (ref 7.5–12.5)
NEUTROS PCT: 52.2 %
Neutro Abs: 3236 cells/uL (ref 1500–7800)
PLATELETS: 175 10*3/uL (ref 140–400)
RBC: 5.34 10*6/uL (ref 4.20–5.80)
RDW: 13.4 % (ref 11.0–15.0)
Total Lymphocyte: 29.3 %
WBC mixed population: 570 cells/uL (ref 200–950)
WBC: 6.2 10*3/uL (ref 3.8–10.8)

## 2018-01-25 LAB — LIPID PANEL
CHOL/HDL RATIO: 4.5 (calc) (ref <5.0)
CHOLESTEROL: 218 mg/dL — AB (ref <200)
HDL: 48 mg/dL (ref >40)
LDL Cholesterol (Calc): 138 mg/dL (calc) — ABNORMAL HIGH
Non-HDL Cholesterol (Calc): 170 mg/dL (calc) — ABNORMAL HIGH (ref <130)
Triglycerides: 187 mg/dL — ABNORMAL HIGH (ref <150)

## 2018-01-26 LAB — HEMOGLOBIN A1C
EAG (MMOL/L): 7.7 (calc)
HEMOGLOBIN A1C: 6.5 %{Hb} — AB (ref <5.7)
MEAN PLASMA GLUCOSE: 140 (calc)

## 2018-01-26 LAB — HEPATITIS C ANTIBODY
Hepatitis C Ab: NONREACTIVE
SIGNAL TO CUT-OFF: 0.02 (ref <1.00)

## 2018-01-26 LAB — HIV ANTIBODY (ROUTINE TESTING W REFLEX): HIV 1&2 Ab, 4th Generation: NONREACTIVE

## 2018-01-26 LAB — PSA, TOTAL WITH REFLEX TO PSA, FREE: PSA, TOTAL: 1.1 ng/mL (ref <=4.0)

## 2018-01-27 ENCOUNTER — Encounter: Payer: Self-pay | Admitting: Family Medicine

## 2018-01-27 ENCOUNTER — Ambulatory Visit (INDEPENDENT_AMBULATORY_CARE_PROVIDER_SITE_OTHER): Payer: Managed Care, Other (non HMO) | Admitting: Family Medicine

## 2018-01-27 VITALS — BP 114/74 | HR 92 | Temp 98.5°F | Resp 16 | Ht 72.0 in | Wt 196.0 lb

## 2018-01-27 DIAGNOSIS — Z0000 Encounter for general adult medical examination without abnormal findings: Secondary | ICD-10-CM | POA: Diagnosis not present

## 2018-01-27 DIAGNOSIS — E782 Mixed hyperlipidemia: Secondary | ICD-10-CM

## 2018-01-27 DIAGNOSIS — R7303 Prediabetes: Secondary | ICD-10-CM | POA: Insufficient documentation

## 2018-01-27 DIAGNOSIS — E785 Hyperlipidemia, unspecified: Secondary | ICD-10-CM | POA: Insufficient documentation

## 2018-01-27 DIAGNOSIS — R7309 Other abnormal glucose: Secondary | ICD-10-CM

## 2018-01-27 NOTE — Patient Instructions (Addendum)
Thank you for coming to the office today.  A1c 6.5 - as discussed you are at risk of future type 2 diabetes  Eat at least 3 meals and 1-2 snacks per day (don't skip breakfast).  Aim for no more than 5 hours between eating. - Tip: If you go >5 hours without eating and become very hungry, your body will supply it's own resources temporarily and you can gain extra weight when you eat.  Diet Recommendations for Preventing Diabetes   REDUCE Starchy (carb) foods include: Bread, rice, pasta, potatoes, corn, crackers, bagels, muffins, all baked goods.   KEEP Protein foods include: Meat, fish, poultry, eggs, dairy foods, and beans such as pinto and kidney beans (beans also provide carbohydrate).   1. Eat at least 3 meals and 1-2 snacks per day. Never go more than 4-5 hours while awake without eating.   2. Limit starchy foods to TWO per meal and ONE per snack. ONE portion of a starchy  food is equal to the following:   - ONE slice of bread (or its equivalent, such as half of a hamburger bun).   - 1/2 cup of a "scoopable" starchy food such as potatoes or rice.   - 1 OUNCE (28 grams) of starchy snacks (crackers or pretzels, look on label).   - 15 grams of carbohydrate as shown on food label.   3. Both lunch and dinner should include a protein food, a carb food, and vegetables.   - Obtain twice as many veg's as protein or carbohydrate foods for both lunch and dinner.   - Try to keep frozen veg's on hand for a quick vegetable serving.     - Fresh or frozen veg's are best.   4. Breakfast should always include protein.     --------------------------------------  Call and schedule general eye evaluation  [facility] [address], [place], [place] [address] Phone: [phone] [email or web address]   Please schedule a Follow-up Appointment to: Return in about 3 months (around 04/29/2018) for PreDM A1c, BP check.  If you have any other questions or concerns, please feel free to call  the office or send a message through MyChart. You may also schedule an earlier appointment if necessary.  Additionally, you may be receiving a survey about your experience at our office within a few days to 1 week by e-mail or mail. We value your feedback.  Saralyn Pilar, DO [facility], [place]

## 2018-01-27 NOTE — Progress Notes (Signed)
Subjective:    Patient ID: Corey Ramos, male    DOB: [birth year], 57 y.o.   MRN: [id number]  Corey Ramos is a 57 y.o. male presenting on 01/27/2018 for Annual Exam   HPI   Here for Annual Exam and Lab Review, completion of biometric form.   ELEVATED A1c / Hyperlipidemia / Overweight BMI >26 - Last visit with me 01/19/18, for initial visit establish care with me about 1 week ago, see prior notes for background information. - Interval update with lab results reviewed showed A1c 6.5 (no other comparison) and elevated lipids. - Today patient reports he was expecting elevated sugar, but no prior readings, he is not familiar with diet approach to high blood sugar - He has not seen nutritionist but declines this today - Not taking any cholesterol medication. Never on statin before. Lifestyle - Diet: he does admit to eating several high carb foods often, drinks mostly water and gatorade, quit drinking soda 1 month ago - Exercise: Active at work in [employer], he does go to [employer] for other exercise about 1-2 weeks out of a month, does not do other cardio exercises regularly  Blurry Vision Since last visit given contact info for [facility], he has not called to schedule yet but plans to do this. Now he has elevated blood sugar, see above. - Persistent past 1 month has some difficulty with blurry vision in distance vision, requires reading glasses for close up reading - Previous eye doctor at [facility] Maintenance:  Routine HIV and Hepatitis C screening negative  Prostate CA Screening: Last PSA 1.1 (12/2017). Currently asymptomatic w/o BPH. No known family history of prostate CA but he has history of father with cancer at age 46 unknown type.  Colon CA Screening: Never had colonoscopy. Currently asymptomatic. No known family history of colon CA again he has father with h/o cancer at age 53. Already referred to [facility] for colonoscopy, they have  contacted him, procedure is TBD   Depression screen [facility] 2/9 01/27/2018 01/19/2018  Decreased Interest 0 0  Down, Depressed, Hopeless 0 0  PHQ - 2 Score 0 0    History reviewed. No pertinent past medical history. History reviewed. No pertinent surgical history. Social History   Socioeconomic History  . Marital status: Single    Spouse name: Not on file  . Number of children: Not on file  . Years of education: 11th Grade / GED  . Highest education level: GED or equivalent  Occupational History  . Occupation: Training and development officer  . Financial resource strain: Not on file  . Food insecurity:    Worry: Not on file    Inability: Not on file  . Transportation needs:    Medical: Not on file    Non-medical: Not on file  Tobacco Use  . Smoking status: Never Smoker  . Smokeless tobacco: Never Used  Substance and Sexual Activity  . Alcohol use: Yes    Alcohol/week: 0.6 - 2.4 oz    Types: 1 - 4 Shots of liquor per week    Comment: social, weekends usually  . Drug use: Never  . Sexual activity: Not on file  Lifestyle  . Physical activity:    Days per week: Not on file    Minutes per session: Not on file  . Stress: Not on file  Relationships  . Social connections:    Talks on phone: Not on file    Gets together:  Not on file    Attends religious service: Not on file    Active member of club or organization: Not on file    Attends meetings of clubs or organizations: Not on file    Relationship status: Not on file  . Intimate partner violence:    Fear of current or ex partner: Not on file    Emotionally abused: Not on file    Physically abused: Not on file    Forced sexual activity: Not on file  Other Topics Concern  . Not on file  Social History Narrative  . Not on file   Family History  Problem Relation Age of Onset  . Cancer Father   . Colon cancer Neg Hx   . Prostate cancer Neg Hx    No current outpatient medications on file prior to visit.   No current  facility-administered medications on file prior to visit.     Review of Systems  Constitutional: Negative for activity change, appetite change, chills, diaphoresis, fatigue and fever.  HENT: Negative for congestion and hearing loss.   Eyes: Positive for visual disturbance (blurry vision).  Respiratory: Negative for apnea, cough, chest tightness, shortness of breath and wheezing.   Cardiovascular: Negative for chest pain, palpitations and leg swelling.  Gastrointestinal: Negative for abdominal pain, anal bleeding, blood in stool, constipation, diarrhea, nausea and vomiting.  Endocrine: Negative for cold intolerance.  Genitourinary: Negative for decreased urine volume, difficulty urinating, dysuria, frequency, hematuria, testicular pain and urgency.  Musculoskeletal: Negative for arthralgias and neck pain.  Skin: Negative for rash.  Allergic/Immunologic: Negative for environmental allergies.  Neurological: Negative for dizziness, weakness, light-headedness, numbness and headaches.  Hematological: Negative for adenopathy.  Psychiatric/Behavioral: Negative for behavioral problems, dysphoric mood and sleep disturbance. The patient is not nervous/anxious.    Per HPI unless specifically indicated above     Objective:    BP 114/74 (BP Location: Left Arm, Cuff Size: Normal)   Pulse 92   Temp 98.5 F (36.9 C) (Oral)   Resp 16   Ht 6' (1.829 m)   Wt 196 lb (88.9 kg)   BMI 26.58 kg/m   Wt Readings from Last 3 Encounters:  01/27/18 196 lb (88.9 kg)  01/19/18 195 lb 12.8 oz (88.8 kg)  12/19/14 195 lb (88.5 kg)    Physical Exam  Constitutional: He is oriented to person, place, and time. He appears well-developed and well-nourished. No distress.  Well-appearing, comfortable, cooperative  HENT:  Head: Normocephalic and atraumatic.  Mouth/Throat: Oropharynx is clear and moist.  Frontal / maxillary sinuses non-tender. Nares patent without purulence or edema. Bilateral TMs clear without  erythema, effusion or bulging. Oropharynx clear without erythema, exudates, edema or asymmetry.  Eyes: Pupils are equal, round, and reactive to light. Conjunctivae and EOM are normal. Right eye exhibits no discharge. Left eye exhibits no discharge.  Neck: Normal range of motion. Neck supple. No thyromegaly present.  Cardiovascular: Normal rate, regular rhythm, normal heart sounds and intact distal pulses.  No murmur heard. Pulmonary/Chest: Effort normal and breath sounds normal. No respiratory distress. He has no wheezes. He has no rales.  Abdominal: Soft. Bowel sounds are normal. He exhibits no distension and no mass. There is no tenderness.  Musculoskeletal: Normal range of motion. He exhibits no edema or tenderness.  Upper / Lower Extremities: - Normal muscle tone, strength bilateral upper extremities 5/5, lower extremities 5/5  Lymphadenopathy:    He has no cervical adenopathy.  Neurological: He is alert and oriented to person, place,  and time.  Distal sensation intact to light touch all extremities  Skin: Skin is warm and dry. No rash noted. He is not diaphoretic. No erythema.  Psychiatric: He has a normal mood and affect. His behavior is normal.  Well groomed, good eye contact, normal speech and thoughts  Nursing note and vitals reviewed.     Results for orders placed or performed in visit on 01/22/18  HIV antibody  Result Value Ref Range   HIV 1&2 Ab, 4th Generation NON-REACTIVE NON-REACTI  Hepatitis C antibody  Result Value Ref Range   Hepatitis C Ab NON-REACTIVE NON-REACTI   SIGNAL TO CUT-OFF 0.02 <1.00  PSA, Total with Reflex to PSA, Free  Result Value Ref Range   PSA, Total 1.1 < OR = 4.0 ng/mL  Lipid panel  Result Value Ref Range   Cholesterol 218 (H) <200 mg/dL   HDL 48 >91 mg/dL   Triglycerides 478 (H) <150 mg/dL   LDL Cholesterol (Calc) 138 (H) mg/dL (calc)   Total CHOL/HDL Ratio 4.5 <5.0 (calc)   Non-HDL Cholesterol (Calc) 170 (H) <130 mg/dL (calc)  COMPLETE  METABOLIC PANEL WITH GFR  Result Value Ref Range   Glucose, Bld 116 (H) 65 - 99 mg/dL   BUN 25 7 - 25 mg/dL   Creat 2.95 6.21 - 3.08 mg/dL   GFR, Est Non African American 71 > OR = 60 mL/min/1.6m2   GFR, Est African American 82 > OR = 60 mL/min/1.55m2   BUN/Creatinine Ratio NOT APPLICABLE 6 - 22 (calc)   Sodium 139 135 - 146 mmol/L   Potassium 4.4 3.5 - 5.3 mmol/L   Chloride 103 98 - 110 mmol/L   CO2 29 20 - 32 mmol/L   Calcium 9.6 8.6 - 10.3 mg/dL   Total Protein 7.1 6.1 - 8.1 g/dL   Albumin 4.3 3.6 - 5.1 g/dL   Globulin 2.8 1.9 - 3.7 g/dL (calc)   AG Ratio 1.5 1.0 - 2.5 (calc)   Total Bilirubin 1.0 0.2 - 1.2 mg/dL   Alkaline phosphatase (APISO) 47 40 - 115 U/L   AST 16 10 - 35 U/L   ALT 12 9 - 46 U/L  CBC with Differential/Platelet  Result Value Ref Range   WBC 6.2 3.8 - 10.8 Thousand/uL   RBC 5.34 4.20 - 5.80 Million/uL   Hemoglobin 15.2 13.2 - 17.1 g/dL   HCT 65.7 84.6 - 96.2 %   MCV 84.3 80.0 - 100.0 fL   MCH 28.5 27.0 - 33.0 pg   MCHC 33.8 32.0 - 36.0 g/dL   RDW 95.2 84.1 - 32.4 %   Platelets 175 140 - 400 Thousand/uL   MPV 11.6 7.5 - 12.5 fL   Neutro Abs 3,236 1,500 - 7,800 cells/uL   Lymphs Abs 1,817 850 - 3,900 cells/uL   WBC mixed population 570 200 - 950 cells/uL   Eosinophils Absolute 515 (H) 15 - 500 cells/uL   Basophils Absolute 62 0 - 200 cells/uL   Neutrophils Relative % 52.2 %   Total Lymphocyte 29.3 %   Monocytes Relative 9.2 %   Eosinophils Relative 8.3 %   Basophils Relative 1.0 %  Hemoglobin A1c  Result Value Ref Range   Hgb A1c MFr Bld 6.5 (H) <5.7 % of total Hgb   Mean Plasma Glucose 140 (calc)   eAG (mmol/L) 7.7 (calc)      Assessment & Plan:   Problem List Items Addressed This Visit    Elevated hemoglobin A1c    New problem identified  elevated A1c 6.5, no prior compare At risk of future dx PreDM vs DM Concern with obesity, HLD  Plan:  1. Long discussion today on hyperglycemia / preDM / DM guidelines diagnosis, lifestyle changes diet  and exercise, medication options - future risk and complications - Not on any therapy currently - remain off for now - Start with lifestyle modification 1st before med - Encourage improved lifestyle - low carb, low sugar diet, reduce portion size, handouts given today with ideal plate, portion size and glycemic index foods, continue improving regular exercise add Cardio - He declined nutrition referral Follow-up 3 months POC A1c      Hyperlipidemia    Uncontrolled cholesterol with improving lifestyle Last lipid panel 12/2017 Calculated ASCVD 10 yr risk score 5.4 (if DM then inc to >10%)  Plan: Encourage improved lifestyle - low carb/cholesterol, reduce portion size, continue improving regular exercise Follow-up A1c results, in future may need statin       Other Visit Diagnoses    Annual physical exam    -  Primary     Updated health maintenance Now due for TDap and colonoscopy, already referred to [facility] Reviewed lab results Encourage improve diet and exercise as discussed, prevent PreDM/DM  No orders of the defined types were placed in this encounter.   Follow up plan: Return in about 3 months (around 04/29/2018) for PreDM A1c, BP check.  Biometric form was completed today and returned to patient, we will get copy to scan to chart.  Saralyn Pilar, DO [facility] 01/28/2018, 1:07 AM

## 2018-01-28 NOTE — Assessment & Plan Note (Signed)
Uncontrolled cholesterol with improving lifestyle Last lipid panel 12/2017 Calculated ASCVD 10 yr risk score 5.4 (if DM then inc to >10%)  Plan: Encourage improved lifestyle - low carb/cholesterol, reduce portion size, continue improving regular exercise Follow-up A1c results, in future may need statin

## 2018-01-28 NOTE — Assessment & Plan Note (Signed)
New problem identified elevated A1c 6.5, no prior compare At risk of future dx PreDM vs DM Concern with obesity, HLD  Plan:  1. Long discussion today on hyperglycemia / preDM / DM guidelines diagnosis, lifestyle changes diet and exercise, medication options - future risk and complications - Not on any therapy currently - remain off for now - Start with lifestyle modification 1st before med - Encourage improved lifestyle - low carb, low sugar diet, reduce portion size, handouts given today with ideal plate, portion size and glycemic index foods, continue improving regular exercise add Cardio - He declined nutrition referral Follow-up 3 months POC A1c

## 2018-04-30 ENCOUNTER — Ambulatory Visit: Payer: Managed Care, Other (non HMO) | Admitting: Family Medicine

## 2018-06-22 ENCOUNTER — Ambulatory Visit: Payer: Managed Care, Other (non HMO) | Admitting: Family Medicine

## 2018-07-27 ENCOUNTER — Ambulatory Visit: Payer: Managed Care, Other (non HMO) | Admitting: Family Medicine

## 2018-08-17 ENCOUNTER — Encounter: Payer: Self-pay | Admitting: Family Medicine

## 2018-08-17 ENCOUNTER — Ambulatory Visit (INDEPENDENT_AMBULATORY_CARE_PROVIDER_SITE_OTHER): Payer: 59 | Admitting: Family Medicine

## 2018-08-17 VITALS — BP 123/85 | HR 73 | Temp 98.5°F | Resp 16 | Ht 72.0 in | Wt 196.0 lb

## 2018-08-17 DIAGNOSIS — E663 Overweight: Secondary | ICD-10-CM | POA: Diagnosis not present

## 2018-08-17 DIAGNOSIS — R7303 Prediabetes: Secondary | ICD-10-CM | POA: Diagnosis not present

## 2018-08-17 LAB — POCT GLYCOSYLATED HEMOGLOBIN (HGB A1C): HEMOGLOBIN A1C: 6.3 % — AB (ref 4.0–5.6)

## 2018-08-17 NOTE — Progress Notes (Signed)
Subjective:    Patient ID: Corey GainerJerry L Awad, male    DOB: 09/13/1961, 57 y.o.   MRN: [id number]  Corey Ramos is a 57 y.o. male presenting on 08/17/2018 for Pre-Diabetes   HPI   Pre-Diabetes / Overweight BMI >26 Last visit 01/2018 for annual physical, interval updates - with reduced alcohol, watching diet, more walking and less stress, he plans to return to gym. He had prior reading A1c 6.5, concerning for new dx of Diabetes. - Interval update now with improved lifestyle, today A1c sugar is 6.3% - He is doing well and has no new concerns, he is continuing to improve lifestyle Lifestyle - Weight stable in 6 months - Diet: remains off soda, improved diet, drinks more water - Exercise: Active at work in [employer], he has done more walking and cardio, but not return to gym Denies hypoglycemia, polyuria, visual changes, numbness or tingling.  Health Maintenance: Still due for colonoscopy, see last visit discussion, he was referred to [facility] - they contacted him but he did not schedule he plans to do this before next visit.  [facility] 2[facility] 08/17/2018 01/27/2018 01/19/2018  Decreased Interest 0 0 0  Down, Depressed, Hopeless 0 0 0  PHQ - 2 Score 0 0 0    Social History   Tobacco Use  . Smoking status: Never Smoker  . Smokeless tobacco: Never Used  Substance Use Topics  . Alcohol use: Yes    Alcohol/week: 1.0 - 4.0 standard drinks    Types: 1 - 4 Shots of liquor per week    Comment: social, weekends usually  . Drug use: Never    Review of Systems Per HPI unless specifically indicated above     Objective:    BP 123/85   Pulse 73   Temp 98.5 F (36.9 C) (Oral)   Resp 16   Ht 6' (1.829 m)   Wt 196 lb (88.9 kg)   BMI 26.58 kg/m   Wt Readings from Last 3 Encounters:  08/17/18 196 lb (88.9 kg)  01/27/18 196 lb (88.9 kg)  01/19/18 195 lb 12.8 oz (88.8 kg)    Physical Exam  Constitutional: He is oriented to person, place, and time. He appears well-developed and  well-nourished. No distress.  Well-appearing, comfortable, cooperative  HENT:  Head: Normocephalic and atraumatic.  Mouth/Throat: Oropharynx is clear and moist.  Eyes: Conjunctivae are normal. Right eye exhibits no discharge. Left eye exhibits no discharge.  Cardiovascular: Normal rate.  Pulmonary/Chest: Effort normal.  Musculoskeletal: He exhibits no edema.  Neurological: He is alert and oriented to person, place, and time.  Skin: Skin is warm and dry. No rash noted. He is not diaphoretic. No erythema.  Psychiatric: He has a normal mood and affect. His behavior is normal.  Well groomed, good eye contact, normal speech and thoughts  Nursing note and vitals reviewed.  Results for orders placed or performed in visit on 08/17/18  POCT HgB A1C  Result Value Ref Range   Hemoglobin A1C 6.3 (A) 4.0 - 5.6 %   Recent Labs    01/25/18 0828 08/17/18 1600  HGBA1C 6.5* 6.3*       Assessment & Plan:   Problem List Items Addressed This Visit    Overweight (BMI 25.0-29.9)    Weight stable in past few months BMI >26 Encourage improve diet / exercise      Pre-diabetes - Primary    Improved A1c to 6.3, from prior 6.5 Reassuring not confirmed type 2 diabetes, now  with improved back to PreDM range At risk of future dx PreDM vs DM Concern with obesity, HLD  Plan:  - Not on any therapy currently - remain off for now - Continue with lifestyle modification - Encourage improved lifestyle - low carb, low sugar diet, reduce portion size, handouts given again today with diet , improve exercise, return to gym and cardio Follow-up 6 months POC A1c      Relevant Orders   POCT HgB A1C (Completed)      No orders of the defined types were placed in this encounter.   Follow up plan: Return in about 6 months (around 02/15/2019) for PreDM A1c.  Saralyn Pilar, DO [facility] 08/17/2018, 3:54 PM

## 2018-08-17 NOTE — Assessment & Plan Note (Signed)
Weight stable in past few months BMI >26 Encourage improve diet / exercise

## 2018-08-17 NOTE — Assessment & Plan Note (Signed)
Improved A1c to 6.3, from prior 6.5 Reassuring not confirmed type 2 diabetes, now with improved back to PreDM range At risk of future dx PreDM vs DM Concern with obesity, HLD  Plan:  - Not on any therapy currently - remain off for now - Continue with lifestyle modification - Encourage improved lifestyle - low carb, low sugar diet, reduce portion size, handouts given again today with diet , improve exercise, return to gym and cardio Follow-up 6 months POC A1c

## 2018-08-17 NOTE — Patient Instructions (Addendum)
Thank you for coming to the office today.  You are in Pre-Diabetic range currently. Keep up the good work.  Keep improving diet as discussed, low carb low sugar options.  Continue to improve exercise, agree with returning to the gym.  Recent Labs    01/25/18 0828 08/17/18 1600  HGBA1C 6.5* 6.3*    Please schedule a Follow-up Appointment to: Return in about 6 months (around 02/15/2019) for PreDM A1c.  If you have any other questions or concerns, please feel free to call the office or send a message through MyChart. You may also schedule an earlier appointment if necessary.  Additionally, you may be receiving a survey about your experience at our office within a few days to 1 week by e-mail or mail. We value your feedback.  Saralyn PilarAlexander , DO [facility]

## 2019-02-23 ENCOUNTER — Ambulatory Visit: Payer: 59 | Admitting: Family Medicine

## 2019-12-09 ENCOUNTER — Ambulatory Visit: Payer: Managed Care, Other (non HMO) | Attending: Internal Medicine

## 2019-12-09 DIAGNOSIS — Z23 Encounter for immunization: Secondary | ICD-10-CM

## 2019-12-09 NOTE — Progress Notes (Signed)
   Covid-19 Vaccination Clinic  Name:  Corey Ramos    MRN: [id number] DOB: Dec 22, 1960  12/09/2019  [name] was observed post Covid-19 immunization for 15 minutes without incident. He was provided with Vaccine Information Sheet and instruction to access the V-Safe system.   [name] was instructed to call 911 with any severe reactions post vaccine: Marland Kitchen Difficulty breathing  . Swelling of face and throat  . A fast heartbeat  . A bad rash all over body  . Dizziness and weakness   Immunizations Administered    Name Date Dose VIS Date Route   Moderna COVID-19 Vaccine 12/09/2019  2:44 PM 0.5 mL 08/30/2019 Intramuscular   Manufacturer: Moderna   Lot: [id number]   NDC: 43539-122-58

## 2020-01-10 ENCOUNTER — Ambulatory Visit: Payer: Managed Care, Other (non HMO) | Attending: Internal Medicine

## 2020-01-10 DIAGNOSIS — Z23 Encounter for immunization: Secondary | ICD-10-CM

## 2020-01-10 NOTE — Progress Notes (Signed)
   Covid-19 Vaccination Clinic  Name:  Corey Ramos    MRN: [id number] DOB: January 01, 1961  01/10/2020  Mr. Corey Ramos was observed post Covid-19 immunization for 15 minutes without incident. He was provided with Vaccine Information Sheet and instruction to access the V-Safe system.   Mr. Corey Ramos was instructed to call 911 with any severe reactions post vaccine: Marland Kitchen Difficulty breathing  . Swelling of face and throat  . A fast heartbeat  . A bad rash all over body  . Dizziness and weakness   Immunizations Administered    Name Date Dose VIS Date Route   Moderna COVID-19 Vaccine 01/10/2020  2:25 PM 0.5 mL 08/30/2019 Intramuscular   Manufacturer: Moderna   Lot: [id number]   NDC: 93241-991-44
